# Patient Record
Sex: Male | Born: 1990 | Race: White | Hispanic: No | Marital: Single
Health system: Southern US, Community
[De-identification: ages and names within clinical notes are randomized; demographics above are authoritative.]

---

## 2009-08-27 ENCOUNTER — Encounter (INDEPENDENT_AMBULATORY_CARE_PROVIDER_SITE_OTHER): Payer: Self-pay | Admitting: Orthopedic Surgery

## 2009-08-27 ENCOUNTER — Ambulatory Visit (HOSPITAL_COMMUNITY): Admission: RE | Admit: 2009-08-27 | Discharge: 2009-08-28 | Payer: Self-pay | Admitting: Orthopedic Surgery

## 2009-09-18 ENCOUNTER — Encounter: Admission: RE | Admit: 2009-09-18 | Discharge: 2009-10-16 | Payer: Self-pay | Admitting: Orthopedic Surgery

## 2010-05-16 IMAGING — CR DG LUMBAR SPINE 2-3V
2 series · 2 of 2 positions shown · non-contrast
Comparison: None.

CLINICAL DATA: Back pain, lumbar herniated disc.

LUMBAR SPINE - 2-3 VIEW

[t l-spine a.p.]
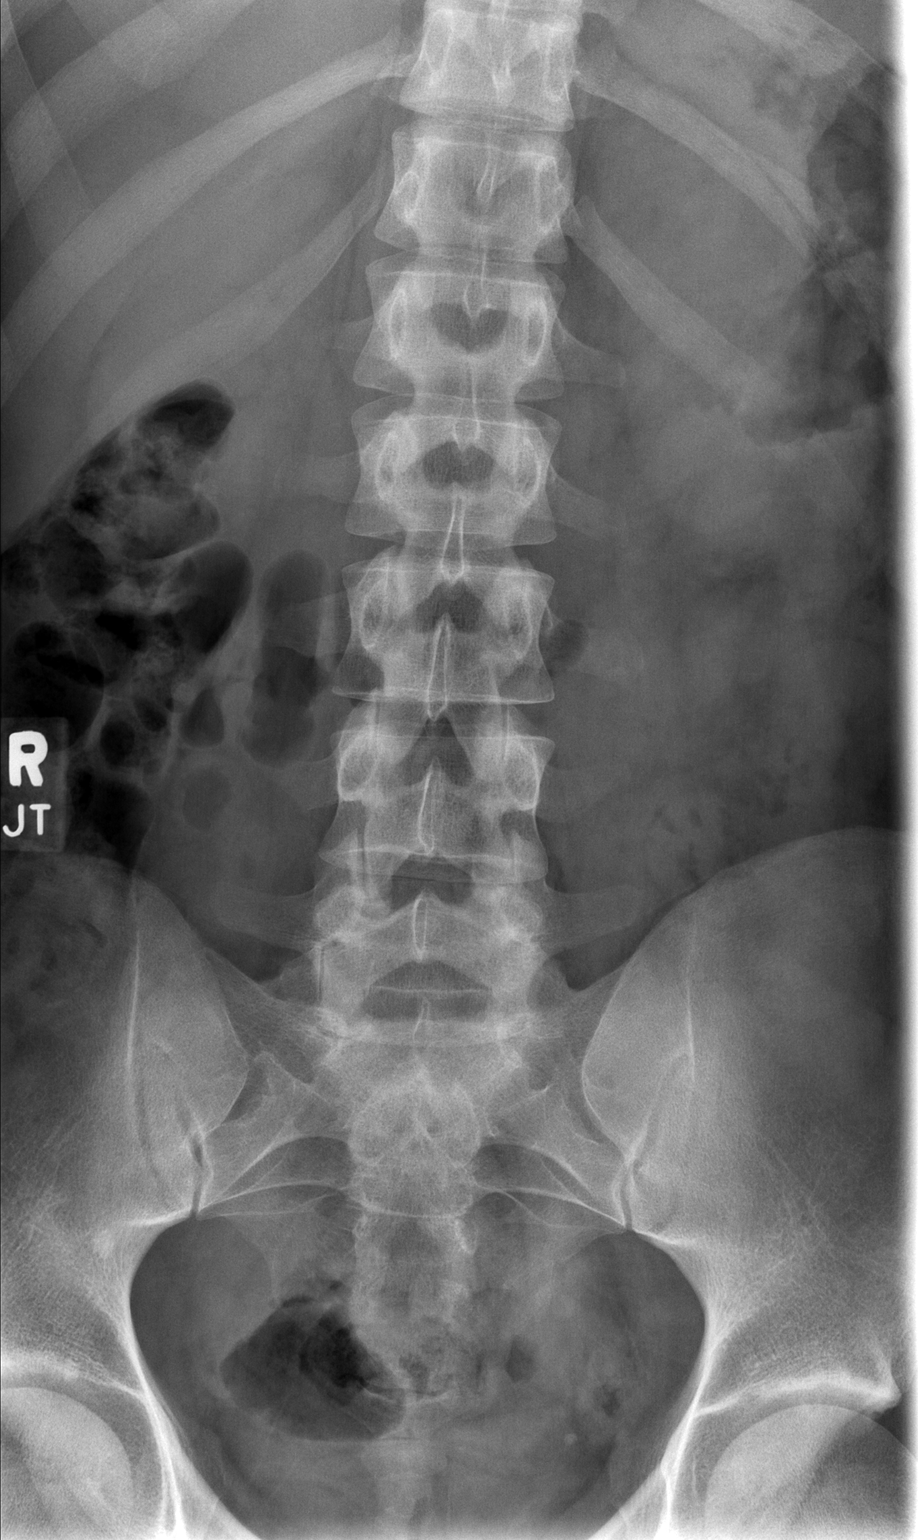

[t l-spine lat]
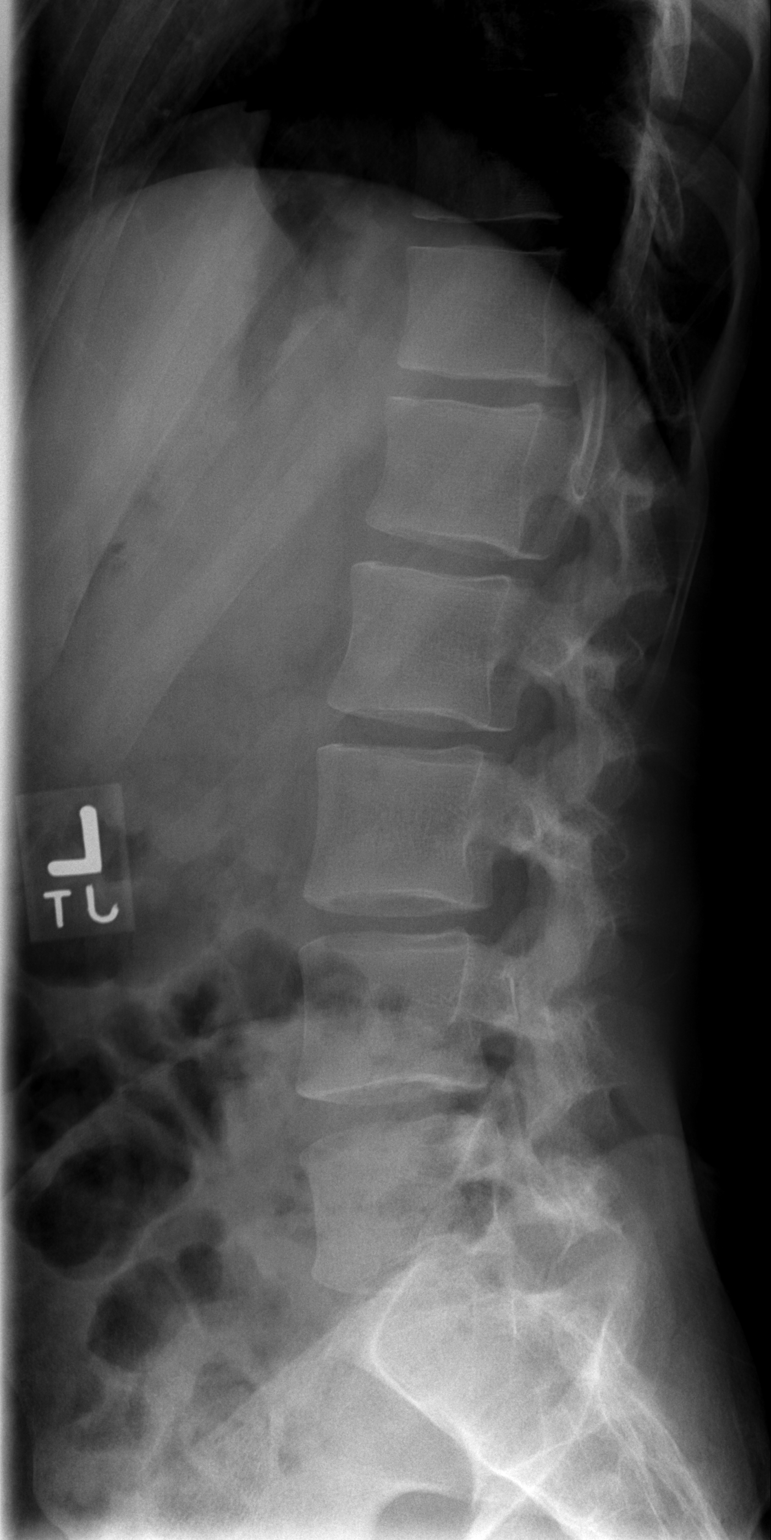

[2 of 2 positions shown; findings below may reference images not displayed]

FINDINGS: Five lumbar type vertebral bodies are assumed.  There is
anatomic alignment with good preservation of intervertebral disc
height.
IMPRESSION: As above.

## 2011-03-26 LAB — COMPREHENSIVE METABOLIC PANEL
AST: 21 U/L (ref 0–37)
BUN: 17 mg/dL (ref 6–23)
CO2: 29 mEq/L (ref 19–32)
Calcium: 10.2 mg/dL (ref 8.4–10.5)
Creatinine, Ser: 1.12 mg/dL (ref 0.4–1.5)
GFR calc Af Amer: >60 mL/min (ref >60)
GFR calc non Af Amer: >60 mL/min (ref >60)
Total Bilirubin: 0.9 mg/dL (ref 0.3–1.2)

## 2011-03-26 LAB — URINALYSIS, ROUTINE W REFLEX MICROSCOPIC
Bilirubin Urine: NEGATIVE
Glucose, UA: NEGATIVE mg/dL
Hgb urine dipstick: NEGATIVE
Protein, ur: NEGATIVE mg/dL
Urobilinogen, UA: 0.2 mg/dL (ref 0.0–1.0)

## 2011-03-26 LAB — TYPE AND SCREEN
ABO/RH(D): O POS
Antibody Screen: NEGATIVE

## 2011-03-26 LAB — CBC
HCT: 45.1 % (ref 39.0–52.0)
MCHC: 34.8 g/dL (ref 30.0–36.0)
MCV: 88 fL (ref 78.0–100.0)
Platelets: 190 10*3/uL (ref 150–400)

## 2011-03-26 LAB — PROTIME-INR
INR: 1 (ref 0.00–1.49)
Prothrombin Time: 13.4 seconds (ref 11.6–15.2)

## 2011-03-26 LAB — DIFFERENTIAL
Basophils Absolute: 0 10*3/uL (ref 0.0–0.1)
Eosinophils Relative: 5 % (ref 0–5)
Lymphocytes Relative: 27 % (ref 12–46)
Lymphs Abs: 1.8 10*3/uL (ref 0.7–4.0)
Neutro Abs: 3.9 10*3/uL (ref 1.7–7.7)

## 2011-03-26 LAB — APTT: aPTT: 29 seconds (ref 24–37)

## 2023-05-22 ENCOUNTER — Other Ambulatory Visit: Payer: Self-pay

## 2023-05-22 ENCOUNTER — Ambulatory Visit: Payer: Managed Care, Other (non HMO)

## 2023-05-22 ENCOUNTER — Ambulatory Visit
Admission: EM | Admit: 2023-05-22 | Discharge: 2023-05-22 | Disposition: A | Discharge status: Home / Self Care | Payer: Managed Care, Other (non HMO)

## 2023-05-22 DIAGNOSIS — S99922A Unspecified injury of left foot, initial encounter: Secondary | ICD-10-CM

## 2023-05-22 DIAGNOSIS — S99912A Unspecified injury of left ankle, initial encounter: Secondary | ICD-10-CM

## 2023-05-22 NOTE — Discharge Instructions (Addendum)
Patient advised of left ankle/left foot x-rays with hard copies provided.  Advised patient to RICE affected area of left ankle and left foot for 30 minutes 3 times daily for the next 3 days.  Advised may take OTC Ibuprofen 800 mg daily or as needed for left ankle/left foot pain.  Advised if symptoms worsen and/or unresolved please follow-up with PCP or [facility] for further evaluation.

## 2023-05-22 NOTE — ED Triage Notes (Signed)
[facility] presents to [facility] with co of left ankle pain. Symptoms started last night after walking outside with a tote and rolling his ankle on the step. Pt has not tried any otc meds.

## 2023-05-22 NOTE — ED Provider Notes (Signed)
[facility]    CSN: [id number] Arrival date & time: 05/22/23  0859      History   Chief Complaint Chief Complaint  Patient presents with   Ankle Pain    HPI Brian King is a 32 y.o. male.   HPI pleasant 32 year old male presents with left ankle pain.  Reports symptoms started last night after walking outside with a tote and rolled his ankle on step.  Patient reports taking no OTC medications for pain.  History reviewed. No pertinent past medical history.  There are no problems to display for this patient.   History reviewed. No pertinent surgical history.     Home Medications    Prior to Admission medications   Medication Sig Start Date End Date Taking? Authorizing Provider  fexofenadine (ALLEGRA) 180 MG tablet Take 180 mg by mouth. 03/04/12  Yes [provider]  montelukast (SINGULAIR) 10 MG tablet Take 10 mg by mouth. 03/04/12  Yes [provider]    Family History History reviewed. No pertinent family history.  Social History     Allergies   Penicillins and Cefaclor   Review of Systems Review of Systems  Musculoskeletal:        Left ankle pain x 1 day  All other systems reviewed and are negative.    Physical Exam Triage Vital Signs ED Triage Vitals  Enc Vitals Group     BP      Pulse      Resp      Temp      Temp src      SpO2      Weight      Height      Head Circumference      Peak Flow      Pain Score      Pain Loc      Pain Edu?      Excl. in GC?    No data found.  Updated Vital Signs BP 116/83   Pulse 65   Temp 98.3 F (36.8 C)   Resp 16   SpO2 98%    Physical Exam Vitals and nursing note reviewed.  Constitutional:      General: He is not in acute distress.    Appearance: Normal appearance. He is normal weight. He is not ill-appearing.  HENT:     Head: Normocephalic and atraumatic.     Mouth/Throat:     Mouth: Mucous membranes are moist.     Pharynx: Oropharynx is clear.  Eyes:      Extraocular Movements: Extraocular movements intact.     Conjunctiva/sclera: Conjunctivae normal.     Pupils: Pupils are equal, round, and reactive to light.  Cardiovascular:     Rate and Rhythm: Normal rate and regular rhythm.     Pulses: Normal pulses.     Heart sounds: Normal heart sounds.  Pulmonary:     Effort: Pulmonary effort is normal.     Breath sounds: Normal breath sounds. No wheezing, rhonchi or rales.  Musculoskeletal:        General: Normal range of motion.     Cervical back: Normal range of motion and neck supple.     Comments: Left ankle/left foot: TTP over dorsum, mild soft tissue swelling, no deformity noted  Skin:    General: Skin is warm and dry.  Neurological:     General: No focal deficit present.     Mental Status: He is alert and oriented to person, place, and  time. Mental status is at baseline.  Psychiatric:        Mood and Affect: Mood normal.        Behavior: Behavior normal.        Thought Content: Thought content normal.      UC Treatments / Results  Labs (all labs ordered are listed, but only abnormal results are displayed) Labs Reviewed - No data to display  EKG   Radiology DG Ankle Complete Left  Result Date: 05/22/2023 CLINICAL DATA:  Rolling injury 1 day ago.  Left ankle pain. EXAM: LEFT ANKLE COMPLETE - 3+ VIEW COMPARISON:  None Available. FINDINGS: There is no evidence of fracture, dislocation, or joint effusion. There is no evidence of arthropathy or other focal bone abnormality. Soft tissues are unremarkable. IMPRESSION: Negative. Electronically Signed   By: Amie Portland M.D.   On: 05/22/2023 09:29   DG Foot Complete Left  Result Date: 05/22/2023 CLINICAL DATA:  Rolling injury 1 day ago.  Left foot pain. EXAM: LEFT FOOT - COMPLETE 3+ VIEW COMPARISON:  None Available. FINDINGS: There is no evidence of fracture or dislocation. There is no evidence of arthropathy or other focal bone abnormality. Soft tissues are unremarkable. IMPRESSION:  Negative. Electronically Signed   By: Amie Portland M.D.   On: 05/22/2023 09:29    Procedures Procedures (including critical care time)  Medications Ordered in UC Medications - No data to display  Initial Impression / Assessment and Plan / UC Course  I have reviewed the triage vital signs and the nursing notes.  Pertinent labs & imaging results that were available during my care of the patient were reviewed by me and considered in my medical decision making (see chart for details).     MDM: 1.  Injury of left ankle, initial encounter-left ankle x-ray revealed above; 2.  Injury of left foot, initial encounter-left foot x-ray revealed above. Patient advised of left ankle/left foot x-rays with hard copies provided.  Advised patient to RICE affected area of left ankle and left foot for 30 minutes 3 times daily for the next 3 days.  Advised may take OTC Ibuprofen 800 mg daily or as needed for left ankle/left foot pain.  Advised if symptoms worsen and/or unresolved please follow-up with PCP or [facility] for further evaluation.  Patient discharged home, hemodynamically stable.  Final Clinical Impressions(s) / UC Diagnoses   Final diagnoses:  Injury of left ankle, initial encounter  Injury of left foot, initial encounter     Discharge Instructions      Patient advised of left ankle/left foot x-rays with hard copies provided.  Advised patient to RICE affected area of left ankle and left foot for 30 minutes 3 times daily for the next 3 days.  Advised may take OTC Ibuprofen 800 mg daily or as needed for left ankle/left foot pain.  Advised if symptoms worsen and/or unresolved please follow-up with PCP or [facility] for further evaluation.     ED Prescriptions   None    PDMP not reviewed this encounter.   Trevor Iha, FNP 05/22/23 [phone]
